# Patient Record
Sex: Female | Born: 2000
Health system: Southern US, Community
[De-identification: ages and names within clinical notes are randomized; demographics above are authoritative.]

## PROBLEM LIST (undated history)

## (undated) HISTORY — PX: APPENDECTOMY: SHX54

## (undated) HISTORY — PX: TYMPANOSTOMY TUBE PLACEMENT: SHX32

---
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
## 2021-01-13 DIAGNOSIS — Z113 Encounter for screening for infections with a predominantly sexual mode of transmission: Secondary | ICD-10-CM | POA: Diagnosis not present

## 2021-01-13 DIAGNOSIS — Z6822 Body mass index (BMI) 22.0-22.9, adult: Secondary | ICD-10-CM | POA: Diagnosis not present

## 2021-01-13 DIAGNOSIS — Z01419 Encounter for gynecological examination (general) (routine) without abnormal findings: Secondary | ICD-10-CM | POA: Diagnosis not present

## 2021-01-13 DIAGNOSIS — Z309 Encounter for contraceptive management, unspecified: Secondary | ICD-10-CM | POA: Diagnosis not present

## 2021-02-07 ENCOUNTER — Encounter: Payer: Self-pay | Admitting: Physician Assistant

## 2021-02-07 ENCOUNTER — Telehealth: Payer: 59 | Admitting: Physician Assistant

## 2021-02-07 DIAGNOSIS — N390 Urinary tract infection, site not specified: Secondary | ICD-10-CM

## 2021-02-07 DIAGNOSIS — R300 Dysuria: Secondary | ICD-10-CM | POA: Diagnosis not present

## 2021-02-07 DIAGNOSIS — R509 Fever, unspecified: Secondary | ICD-10-CM

## 2021-02-07 NOTE — Progress Notes (Signed)
Based on what you shared with me, I feel your condition warrants further evaluation and I recommend that you be seen in a face to face visit.    Having urinary symptoms and fever may indicate that you have a more complicated infection such as a kidney infection, and warrants that you have a face to face visit for further evaluation.   NOTE: There will be NO CHARGE for this eVisit   If you are having a true medical emergency please call 911.      For an urgent face to face visit, [facility] has six urgent care centers for your convenience:     [facility] Get Driving Directions [id number] [place], [place] [address]    [facility]) Get Driving Directions [id number] [place], [place] [address]  [facility] ([place]) Get Driving Directions [id number] [address] [place],  [place]  [address]  [facility] Get Driving Directions [id number] [place], [place], [place] [address]   [facility] Get Driving Directions  [id number] [address] [place], [place] [address]   [facility] Get Driving Directions [id number] [address] [place], [place] [address]  Your MyChart E-visit questionnaire answers were reviewed by a board certified advanced clinical practitioner to complete your personal care plan based on your specific symptoms.  Thank you for using e-Visits.   I spent 5-10 minutes on review and completion of this note- Lacy Duverney [facility]

## 2021-06-07 HISTORY — PX: ARTHROSCOPIC REPAIR ACL: SUR80

## 2021-07-24 DIAGNOSIS — H5213 Myopia, bilateral: Secondary | ICD-10-CM | POA: Diagnosis not present

## 2021-07-24 DIAGNOSIS — H52222 Regular astigmatism, left eye: Secondary | ICD-10-CM | POA: Diagnosis not present

## 2022-01-14 DIAGNOSIS — Z113 Encounter for screening for infections with a predominantly sexual mode of transmission: Secondary | ICD-10-CM | POA: Diagnosis not present

## 2022-01-14 DIAGNOSIS — Z01419 Encounter for gynecological examination (general) (routine) without abnormal findings: Secondary | ICD-10-CM | POA: Diagnosis not present

## 2022-01-14 DIAGNOSIS — Z6823 Body mass index (BMI) 23.0-23.9, adult: Secondary | ICD-10-CM | POA: Diagnosis not present

## 2022-03-11 DIAGNOSIS — S8992XA Unspecified injury of left lower leg, initial encounter: Secondary | ICD-10-CM | POA: Diagnosis not present

## 2022-03-11 DIAGNOSIS — R936 Abnormal findings on diagnostic imaging of limbs: Secondary | ICD-10-CM | POA: Diagnosis not present

## 2022-03-17 DIAGNOSIS — M2392 Unspecified internal derangement of left knee: Secondary | ICD-10-CM | POA: Diagnosis not present

## 2022-03-17 DIAGNOSIS — S8992XD Unspecified injury of left lower leg, subsequent encounter: Secondary | ICD-10-CM | POA: Diagnosis not present

## 2022-03-24 DIAGNOSIS — S83512A Sprain of anterior cruciate ligament of left knee, initial encounter: Secondary | ICD-10-CM | POA: Diagnosis not present

## 2022-03-24 DIAGNOSIS — S82132A Displaced fracture of medial condyle of left tibia, initial encounter for closed fracture: Secondary | ICD-10-CM | POA: Diagnosis not present

## 2022-04-02 DIAGNOSIS — S83512A Sprain of anterior cruciate ligament of left knee, initial encounter: Secondary | ICD-10-CM | POA: Diagnosis not present

## 2022-05-12 DIAGNOSIS — S83512D Sprain of anterior cruciate ligament of left knee, subsequent encounter: Secondary | ICD-10-CM | POA: Diagnosis not present

## 2022-05-25 DIAGNOSIS — M6752 Plica syndrome, left knee: Secondary | ICD-10-CM | POA: Diagnosis not present

## 2022-05-25 DIAGNOSIS — S83512A Sprain of anterior cruciate ligament of left knee, initial encounter: Secondary | ICD-10-CM | POA: Diagnosis not present

## 2022-09-13 DIAGNOSIS — S83512D Sprain of anterior cruciate ligament of left knee, subsequent encounter: Secondary | ICD-10-CM | POA: Diagnosis not present

## 2022-10-28 DIAGNOSIS — M25562 Pain in left knee: Secondary | ICD-10-CM | POA: Diagnosis not present

## 2022-10-28 DIAGNOSIS — S83512D Sprain of anterior cruciate ligament of left knee, subsequent encounter: Secondary | ICD-10-CM | POA: Diagnosis not present

## 2022-11-11 DIAGNOSIS — S83512D Sprain of anterior cruciate ligament of left knee, subsequent encounter: Secondary | ICD-10-CM | POA: Diagnosis not present

## 2022-11-11 DIAGNOSIS — M25562 Pain in left knee: Secondary | ICD-10-CM | POA: Diagnosis not present

## 2022-11-15 DIAGNOSIS — Z8739 Personal history of other diseases of the musculoskeletal system and connective tissue: Secondary | ICD-10-CM | POA: Diagnosis not present

## 2022-11-25 DIAGNOSIS — S83512D Sprain of anterior cruciate ligament of left knee, subsequent encounter: Secondary | ICD-10-CM | POA: Diagnosis not present

## 2022-11-25 DIAGNOSIS — M25562 Pain in left knee: Secondary | ICD-10-CM | POA: Diagnosis not present

## 2022-12-15 DIAGNOSIS — R319 Hematuria, unspecified: Secondary | ICD-10-CM | POA: Diagnosis not present

## 2022-12-15 DIAGNOSIS — R350 Frequency of micturition: Secondary | ICD-10-CM | POA: Diagnosis not present

## 2022-12-15 DIAGNOSIS — N398 Other specified disorders of urinary system: Secondary | ICD-10-CM | POA: Diagnosis not present

## 2022-12-15 DIAGNOSIS — N898 Other specified noninflammatory disorders of vagina: Secondary | ICD-10-CM | POA: Diagnosis not present

## 2022-12-15 DIAGNOSIS — Z113 Encounter for screening for infections with a predominantly sexual mode of transmission: Secondary | ICD-10-CM | POA: Diagnosis not present

## 2022-12-17 DIAGNOSIS — M25562 Pain in left knee: Secondary | ICD-10-CM | POA: Diagnosis not present

## 2022-12-17 DIAGNOSIS — S83512D Sprain of anterior cruciate ligament of left knee, subsequent encounter: Secondary | ICD-10-CM | POA: Diagnosis not present

## 2023-03-02 DIAGNOSIS — K529 Noninfective gastroenteritis and colitis, unspecified: Secondary | ICD-10-CM | POA: Diagnosis not present

## 2023-03-14 DIAGNOSIS — Z8739 Personal history of other diseases of the musculoskeletal system and connective tissue: Secondary | ICD-10-CM | POA: Diagnosis not present

## 2023-03-21 DIAGNOSIS — H52222 Regular astigmatism, left eye: Secondary | ICD-10-CM | POA: Diagnosis not present

## 2023-03-21 DIAGNOSIS — H5213 Myopia, bilateral: Secondary | ICD-10-CM | POA: Diagnosis not present

## 2023-04-28 DIAGNOSIS — L2089 Other atopic dermatitis: Secondary | ICD-10-CM | POA: Diagnosis not present

## 2023-04-28 DIAGNOSIS — L853 Xerosis cutis: Secondary | ICD-10-CM | POA: Diagnosis not present

## 2023-04-28 DIAGNOSIS — L2989 Other pruritus: Secondary | ICD-10-CM | POA: Diagnosis not present

## 2023-04-28 DIAGNOSIS — L710 Perioral dermatitis: Secondary | ICD-10-CM | POA: Diagnosis not present

## 2023-05-25 DIAGNOSIS — Z01419 Encounter for gynecological examination (general) (routine) without abnormal findings: Secondary | ICD-10-CM | POA: Diagnosis not present

## 2023-05-25 DIAGNOSIS — Z309 Encounter for contraceptive management, unspecified: Secondary | ICD-10-CM | POA: Diagnosis not present

## 2023-05-25 DIAGNOSIS — Z6824 Body mass index (BMI) 24.0-24.9, adult: Secondary | ICD-10-CM | POA: Diagnosis not present

## 2023-05-25 DIAGNOSIS — Z113 Encounter for screening for infections with a predominantly sexual mode of transmission: Secondary | ICD-10-CM | POA: Diagnosis not present

## 2023-05-25 DIAGNOSIS — Z124 Encounter for screening for malignant neoplasm of cervix: Secondary | ICD-10-CM | POA: Diagnosis not present

## 2023-07-15 ENCOUNTER — Telehealth: Payer: 59 | Admitting: Physician Assistant

## 2023-07-15 DIAGNOSIS — R6889 Other general symptoms and signs: Secondary | ICD-10-CM | POA: Diagnosis not present

## 2023-07-15 MED ORDER — FLUTICASONE PROPIONATE 50 MCG/ACT NA SUSP: 2.0000 | Freq: Every day | NASAL | 0 refills | Status: DC

## 2023-07-15 MED ORDER — BENZONATATE 100 MG PO CAPS: 100.0000 mg | ORAL_CAPSULE | Freq: Three times a day (TID) | ORAL | 0 refills | Status: DC | PRN

## 2023-07-15 NOTE — Progress Notes (Signed)
 E visit for Flu like symptoms   We are sorry that you are not feeling well.  Here is how we plan to help! Based on what you have shared with me it looks like you may have a respiratory virus that may be influenza.  Influenza or "the flu" is   an infection caused by a respiratory virus. The flu virus is highly contagious and persons who did not receive their yearly flu vaccination may "catch" the flu from close contact.  We have anti-viral medications to treat the viruses that cause this infection. They are not a "cure" and only shorten the course of the infection. These prescriptions are most effective when they are given within the first 2 days of "flu" symptoms. Antiviral medication are indicated if you have a high risk of complications from the flu.   Based upon your symptoms and potential risk factors I recommend that you follow the flu symptoms recommendation that I have listed below.  Upper respiratory infections can be transmitted from person to person, with the most common method of transmission being a person's hands.  The virus is able to live on the skin and can infect other persons for up to 2 hours after direct contact.  Also, these can be transmitted when someone coughs or sneezes; thus, it is important to cover the mouth to reduce this risk.  To keep the spread of the illness at bay, good hand hygiene is very important.  This is an infection that is most likely caused by a virus. There are no specific treatments other than to help you with the symptoms until the infection runs its course.  We are sorry you are not feeling well.  Here is how we plan to help!  For nasal congestion, you may use an oral decongestants such as Mucinex D or if you have glaucoma or high blood pressure use plain Mucinex.  Saline nasal spray or nasal drops can help and can safely be used as often as needed for congestion.  For your congestion, I have prescribed Fluticasone  nasal spray one spray in each nostril  twice a day  If you do not have a history of heart disease, hypertension, diabetes or thyroid disease, prostate/bladder issues or glaucoma, you may also use Sudafed to treat nasal congestion.  It is highly recommended that you consult with a pharmacist or your primary care physician to ensure this medication is safe for you to take.     If you have a cough, you may use cough suppressants such as Delsym and Robitussin.  If you have glaucoma or high blood pressure, you can also use Coricidin HBP.   For cough I have prescribed for you A prescription cough medication called Tessalon  Perles 100 mg. You may take 1-2 capsules every 8 hours as needed for cough  If you have a sore or scratchy throat, use a saltwater gargle-  to  teaspoon of salt dissolved in a 4-ounce to 8-ounce glass of warm water.  Gargle the solution for approximately 15-30 seconds and then spit.  It is important not to swallow the solution.  You can also use throat lozenges/cough drops and Chloraseptic spray to help with throat pain or discomfort.  Warm or cold liquids can also be helpful in relieving throat pain.  For headache, pain or general discomfort, you can use Ibuprofen or Tylenol  as directed.   Some authorities believe that zinc sprays or the use of Echinacea may shorten the course of your symptoms.  HOME  CARE Only take medications as instructed by your medical team. Be sure to drink plenty of fluids. Water is fine as well as fruit juices, sodas and electrolyte beverages. You may want to stay away from caffeine or alcohol. If you are nauseated, try taking small sips of liquids. How do you know if you are getting enough fluid? Your urine should be a pale yellow or almost colorless. Get rest. Taking a steamy shower or using a humidifier may help nasal congestion and ease sore throat pain. You can place a towel over your head and breathe in the steam from hot water coming from a faucet. Using a saline nasal spray works much the  same way. Cough drops, hard candies and sore throat lozenges may ease your cough. Avoid close contacts especially the very young and the elderly Cover your mouth if you cough or sneeze Always remember to wash your hands.   ANYONE WHO HAS FLU SYMPTOMS SHOULD: Stay home. The flu is highly contagious and going out or to work exposes others! Be sure to drink plenty of fluids. Water is fine as well as fruit juices, sodas and electrolyte beverages. You may want to stay away from caffeine or alcohol. If you are nauseated, try taking small sips of liquids. How do you know if you are getting enough fluid? Your urine should be a pale yellow or almost colorless. Get rest. Taking a steamy shower or using a humidifier may help nasal congestion and ease sore throat pain. Using a saline nasal spray works much the same way. Cough drops, hard candies and sore throat lozenges may ease your cough. Line up a caregiver. Have someone check on you regularly.   GET HELP RIGHT AWAY IF: You cannot keep down liquids or your medications. You become short of breath Your fell like you are going to pass out or loose consciousness. Your symptoms persist after you have completed your treatment plan MAKE SURE YOU  Understand these instructions. Will watch your condition. Will get help right away if you are not doing well or get worse.  Your e-visit answers were reviewed by a board certified advanced clinical practitioner to complete your personal care plan.  Depending on the condition, your plan could have included both over the counter or prescription medications.  If there is a problem please reply  once you have received a response from your provider.  Your safety is important to us .  If you have drug allergies check your prescription carefully.    You can use MyChart to ask questions about today's visit, request a non-urgent call back, or ask for a work or school excuse for 24 hours related to this e-Visit. If it  has been greater than 24 hours you will need to follow up with your provider, or enter a new e-Visit to address those concerns.  You will get an e-mail in the next two days asking about your experience.  I hope that your e-visit has been valuable and will speed your recovery. Thank you for using e-visits.    I have spent 5 minutes in review of e-visit questionnaire, review and updating patient chart, medical decision making and response to patient.   Delon CHRISTELLA Dickinson, PA-C

## 2023-09-27 DIAGNOSIS — Z1330 Encounter for screening examination for mental health and behavioral disorders, unspecified: Secondary | ICD-10-CM | POA: Diagnosis not present

## 2023-09-27 DIAGNOSIS — Z113 Encounter for screening for infections with a predominantly sexual mode of transmission: Secondary | ICD-10-CM | POA: Diagnosis not present

## 2023-09-28 DIAGNOSIS — Z113 Encounter for screening for infections with a predominantly sexual mode of transmission: Secondary | ICD-10-CM | POA: Diagnosis not present

## 2024-01-12 ENCOUNTER — Encounter (HOSPITAL_COMMUNITY): Payer: Self-pay

## 2024-01-12 ENCOUNTER — Ambulatory Visit (HOSPITAL_COMMUNITY): Admission: EM | Admit: 2024-01-12 | Discharge: 2024-01-12 | Disposition: A | Discharge status: Home / Self Care

## 2024-01-12 DIAGNOSIS — Z203 Contact with and (suspected) exposure to rabies: Secondary | ICD-10-CM

## 2024-01-12 DIAGNOSIS — Z23 Encounter for immunization: Secondary | ICD-10-CM | POA: Diagnosis not present

## 2024-01-12 MED ORDER — TETANUS-DIPHTH-ACELL PERTUSSIS 5-2.5-18.5 LF-MCG/0.5 IM SUSY
PREFILLED_SYRINGE | INTRAMUSCULAR | Status: AC
  Filled 2024-01-12: qty 0.5

## 2024-01-12 MED ORDER — RABIES IMMUNE GLOBULIN 300 UNIT/2ML IJ SOLN
20.0000 [IU]/kg | Freq: Once | INTRAMUSCULAR | Status: AC
  Administered 2024-01-12: 1200 [IU] via INTRAMUSCULAR

## 2024-01-12 MED ORDER — RABIES IMMUNE GLOBULIN 1500 UNIT/10ML IJ SOLN
INTRAMUSCULAR | Status: AC
Start: 2024-01-12 — End: 2024-01-12
  Filled 2024-01-12: qty 10

## 2024-01-12 MED ORDER — RABIES VACCINE, PCEC IM SUSR
1.0000 mL | Freq: Once | INTRAMUSCULAR | Status: AC
  Administered 2024-01-12: 1 mL via INTRAMUSCULAR

## 2024-01-12 MED ORDER — RABIES VACCINE, PCEC IM SUSR
INTRAMUSCULAR | Status: AC
  Filled 2024-01-12: qty 1

## 2024-01-12 MED ORDER — TETANUS-DIPHTH-ACELL PERTUSSIS 5-2.5-18.5 LF-MCG/0.5 IM SUSY: 0.5000 mL | PREFILLED_SYRINGE | Freq: Once | INTRAMUSCULAR | Status: DC

## 2024-01-12 NOTE — ED Triage Notes (Signed)
 Patient presenting with exposure to a bat onset Tuesday night. Patient was at an outside concert when a bat came flying and hit the back of her head. Unsure if bitten. Denies any pain, itching, or any symptoms.  Prescriptions or OTC medications tried: No

## 2024-01-12 NOTE — Discharge Instructions (Addendum)
  1. Need for post exposure prophylaxis for rabies (Primary) - rabies immune globulin  (HYPERRAB) injection 1,200 Units - rabies vaccine  (RABAVERT ) injection 1 mL - Tdap (BOOSTRIX ) injection 0.5 mL - Vaccine initiated today along with Tdap for tetanus prevention. - Follow-up vaccinations on day 3, day 7, and day 14 to be completed. - Please return to the clinic or make an appointment online for follow-up vaccinations on the corresponding days. - Full rabies postexposure prophylaxis is not complete until all 4 vaccinations are completed.

## 2024-01-12 NOTE — ED Provider Notes (Signed)
 [facility]  Note:  This document was prepared using Dragon voice recognition software and may include unintentional dictation errors.  MRN: [id number] DOB: 2000/11/16  Subjective:   Jaime Atkins is a 23 y.o. female presenting for possible exposure to rabies.  Patient reports that she was at an outdoor concert around dusk when which she believes was a bat flew into the back of her head and then quickly flew away.  Patient does not have any scratches or bite marks but was concerned because of direct exposure to a bat.  Patient was unsure if she needed to have a rabies vaccination due to the exposure.  Denies any symptoms or pain to the area.   Current Facility-Administered Medications:    rabies immune globulin  (HYPERRAB) injection 1,200 Units, 20 Units/kg, Intramuscular, Once, Otniel Hoe B, NP   rabies vaccine  (RABAVERT ) injection 1 mL, 1 mL, Intramuscular, Once, Zerina Hallinan B, NP   Tdap (BOOSTRIX ) injection 0.5 mL, 0.5 mL, Intramuscular, Once, Roney Youtz B, NP  Current Outpatient Medications:    levonorgestrel  (MIRENA , 52 MG,) 20 MCG/DAY IUD, 1 each by Intrauterine route once., Disp: , Rfl:    No Known Allergies  History reviewed. No pertinent past medical history.   Past Surgical History:  Procedure Laterality Date   APPENDECTOMY     ARTHROSCOPIC REPAIR ACL  2023   LAPAROSCOPIC APPENDECTOMY  10/19/2011   Procedure: APPENDECTOMY LAPAROSCOPIC;  Surgeon: CHRISTELLA. Julietta Millman, MD;  Location: [facility] OR;  Service: Pediatrics;  Laterality: N/A;   TYMPANOSTOMY TUBE PLACEMENT      History reviewed. No pertinent family history.  Social History   Tobacco Use   Smoking status: Never   Smokeless tobacco: Never  Vaping Use   Vaping status: Never Used  Substance Use Topics   Alcohol use: Yes    Comment: social   Drug use: Never    ROS Refer to HPI for ROS details.  Objective:   Vitals: BP 116/78 (BP Location: Left Arm)   Pulse 100   Temp 98.2  F (36.8 C) (Oral)   Resp 18   Ht 5' 2 (1.575 m)   Wt 136 lb (61.7 kg)   LMP  (LMP Unknown)   SpO2 98%   BMI 24.87 kg/m   Physical Exam Vitals and nursing note reviewed.  Constitutional:      General: She is not in acute distress.    Appearance: Normal appearance. She is well-developed. She is not ill-appearing or toxic-appearing.  HENT:     Head: Normocephalic and atraumatic.     Nose: Nose normal.     Mouth/Throat:     Mouth: Mucous membranes are moist.  Cardiovascular:     Rate and Rhythm: Normal rate.  Pulmonary:     Effort: Pulmonary effort is normal. No respiratory distress.  Musculoskeletal:     Cervical back: Normal range of motion and neck supple. No rigidity or tenderness.  Skin:    General: Skin is warm and dry.  Neurological:     General: No focal deficit present.     Mental Status: She is alert and oriented to person, place, and time.  Psychiatric:        Mood and Affect: Mood normal.        Behavior: Behavior normal.     Procedures  No results found for this or any previous visit (from the past 24 hours).  No results found.   Assessment and Plan :     Discharge Instructions  1. Need for post exposure prophylaxis for rabies (Primary) - rabies immune globulin  (HYPERRAB) injection 1,200 Units - rabies vaccine  (RABAVERT ) injection 1 mL - Tdap (BOOSTRIX ) injection 0.5 mL - Vaccine initiated today along with Tdap for tetanus prevention. - Follow-up vaccinations on day 3, day 7, and day 14 to be completed. - Please return to the clinic or make an appointment online for follow-up vaccinations on the corresponding days. - Full rabies postexposure prophylaxis is not complete until all 4 vaccinations are completed.      Mylik Pro B Benjamim Harnish   Danyella Mcginty, [place] B, [place] 01/12/24 [phone]

## 2024-01-15 ENCOUNTER — Ambulatory Visit (HOSPITAL_COMMUNITY)
Admission: RE | Admit: 2024-01-15 | Discharge: 2024-01-15 | Disposition: A | Discharge status: Home / Self Care | Payer: Self-pay | Source: Ambulatory Visit | Attending: Family Medicine | Admitting: Family Medicine

## 2024-01-15 ENCOUNTER — Encounter (HOSPITAL_COMMUNITY): Payer: Self-pay

## 2024-01-15 ENCOUNTER — Ambulatory Visit (HOSPITAL_COMMUNITY): Payer: Self-pay

## 2024-01-15 DIAGNOSIS — Z203 Contact with and (suspected) exposure to rabies: Secondary | ICD-10-CM | POA: Diagnosis not present

## 2024-01-15 MED ORDER — RABIES VACCINE, PCEC IM SUSR
1.0000 mL | Freq: Once | INTRAMUSCULAR | Status: AC
  Administered 2024-01-15: 1 mL via INTRAMUSCULAR

## 2024-01-15 MED ORDER — RABIES VACCINE, PCEC IM SUSR
INTRAMUSCULAR | Status: AC
  Filled 2024-01-15: qty 1

## 2024-01-15 NOTE — ED Triage Notes (Signed)
 Patient here for Day 3 Rabies vaccine .

## 2024-01-19 ENCOUNTER — Ambulatory Visit (HOSPITAL_COMMUNITY)
Admission: RE | Admit: 2024-01-19 | Discharge: 2024-01-19 | Disposition: A | Discharge status: Home / Self Care | Payer: Self-pay | Source: Ambulatory Visit | Attending: Family Medicine

## 2024-01-19 DIAGNOSIS — Z203 Contact with and (suspected) exposure to rabies: Secondary | ICD-10-CM

## 2024-01-19 DIAGNOSIS — Z23 Encounter for immunization: Secondary | ICD-10-CM

## 2024-01-19 MED ORDER — RABIES VACCINE, PCEC IM SUSR
1.0000 mL | Freq: Once | INTRAMUSCULAR | Status: AC
  Administered 2024-01-19: 1 mL via INTRAMUSCULAR

## 2024-01-19 MED ORDER — RABIES VACCINE, PCEC IM SUSR
INTRAMUSCULAR | Status: AC
  Filled 2024-01-19: qty 1

## 2024-01-19 NOTE — ED Triage Notes (Signed)
 Patient presenting for her day 7 rabies vaccine . Denies any reactions/issues to previous injections.

## 2024-01-19 NOTE — ED Notes (Signed)
 Day 7 rabies vaccine given in the left deltoid and tolerated well.

## 2024-01-26 ENCOUNTER — Ambulatory Visit (HOSPITAL_COMMUNITY)
Admission: EM | Admit: 2024-01-26 | Discharge: 2024-01-26 | Disposition: A | Discharge status: Home / Self Care | Attending: Internal Medicine | Admitting: Internal Medicine

## 2024-01-26 ENCOUNTER — Encounter (HOSPITAL_COMMUNITY): Payer: Self-pay

## 2024-01-26 DIAGNOSIS — Z203 Contact with and (suspected) exposure to rabies: Secondary | ICD-10-CM | POA: Diagnosis not present

## 2024-01-26 DIAGNOSIS — Z23 Encounter for immunization: Secondary | ICD-10-CM | POA: Diagnosis not present

## 2024-01-26 MED ORDER — RABIES VACCINE, PCEC IM SUSR
INTRAMUSCULAR | Status: AC
  Filled 2024-01-26: qty 1

## 2024-01-26 MED ORDER — RABIES VACCINE, PCEC IM SUSR
1.0000 mL | Freq: Once | INTRAMUSCULAR | Status: AC
  Administered 2024-01-26: 1 mL via INTRAMUSCULAR

## 2024-01-26 NOTE — ED Triage Notes (Signed)
 Pt here for 4th and final rabies vaccination. Denies any complications.

## 2024-02-27 DIAGNOSIS — Z30432 Encounter for removal of intrauterine contraceptive device: Secondary | ICD-10-CM | POA: Diagnosis not present

## 2024-04-02 DIAGNOSIS — D1801 Hemangioma of skin and subcutaneous tissue: Secondary | ICD-10-CM | POA: Diagnosis not present

## 2024-04-02 DIAGNOSIS — L814 Other melanin hyperpigmentation: Secondary | ICD-10-CM | POA: Diagnosis not present

## 2024-04-02 DIAGNOSIS — L821 Other seborrheic keratosis: Secondary | ICD-10-CM | POA: Diagnosis not present
